# Patient Record
Sex: Male | Born: 1960
Health system: Southern US, Community
[De-identification: ages and names within clinical notes are randomized; demographics above are authoritative.]

---

## 2003-05-01 ENCOUNTER — Encounter (INDEPENDENT_AMBULATORY_CARE_PROVIDER_SITE_OTHER): Payer: Self-pay | Admitting: *Deleted

## 2003-05-01 ENCOUNTER — Ambulatory Visit (HOSPITAL_BASED_OUTPATIENT_CLINIC_OR_DEPARTMENT_OTHER): Admission: RE | Admit: 2003-05-01 | Discharge: 2003-05-01 | Payer: Self-pay | Admitting: Plastic Surgery

## 2003-05-01 ENCOUNTER — Ambulatory Visit (HOSPITAL_COMMUNITY): Admission: RE | Admit: 2003-05-01 | Discharge: 2003-05-01 | Payer: Self-pay | Admitting: Plastic Surgery

## 2003-06-21 ENCOUNTER — Ambulatory Visit (HOSPITAL_BASED_OUTPATIENT_CLINIC_OR_DEPARTMENT_OTHER): Admission: RE | Admit: 2003-06-21 | Discharge: 2003-06-21 | Payer: Self-pay | Admitting: Internal Medicine

## 2004-06-22 ENCOUNTER — Encounter: Admission: RE | Admit: 2004-06-22 | Discharge: 2004-06-22 | Payer: Self-pay | Admitting: Internal Medicine

## 2009-07-03 ENCOUNTER — Ambulatory Visit (HOSPITAL_COMMUNITY): Admission: RE | Admit: 2009-07-03 | Discharge: 2009-07-04 | Payer: Self-pay | Admitting: Otolaryngology

## 2009-10-07 ENCOUNTER — Ambulatory Visit (HOSPITAL_BASED_OUTPATIENT_CLINIC_OR_DEPARTMENT_OTHER): Admission: RE | Admit: 2009-10-07 | Discharge: 2009-10-07 | Payer: Self-pay | Admitting: Otolaryngology

## 2009-10-10 ENCOUNTER — Ambulatory Visit: Payer: Self-pay | Admitting: Internal Medicine

## 2010-07-12 LAB — CBC
HCT: 46.6 % (ref 39.0–52.0)
Hemoglobin: 16.1 g/dL (ref 13.0–17.0)
MCHC: 34.6 g/dL (ref 30.0–36.0)
MCV: 85.4 fL (ref 78.0–100.0)
Platelets: 229 10*3/uL (ref 150–400)
RBC: 5.46 MIL/uL (ref 4.22–5.81)
RDW: 13.5 % (ref 11.5–15.5)
WBC: 6.2 10*3/uL (ref 4.0–10.5)

## 2010-07-12 LAB — BASIC METABOLIC PANEL
BUN: 10 mg/dL (ref 6–23)
CO2: 27 mEq/L (ref 19–32)
Calcium: 9.5 mg/dL (ref 8.4–10.5)
Chloride: 108 mEq/L (ref 96–112)
Creatinine, Ser: 1.06 mg/dL (ref 0.4–1.5)
GFR calc Af Amer: >60 mL/min (ref >60)
GFR calc non Af Amer: >60 mL/min (ref >60)
Glucose, Bld: 102 mg/dL — ABNORMAL HIGH (ref 70–99)
Potassium: 4 mEq/L (ref 3.5–5.1)
Sodium: 141 mEq/L (ref 135–145)

## 2010-12-28 ENCOUNTER — Other Ambulatory Visit: Payer: Self-pay

## 2011-04-22 IMAGING — CR DG CHEST 2V
2 series · 2 of 2 positions shown · non-contrast
Comparison: 06/22/2004

CLINICAL DATA: Preoperative respiratory exam for septoplasty.
Sleep apnea.

CHEST - 2 VIEW

[view not recorded (1 of 2)]
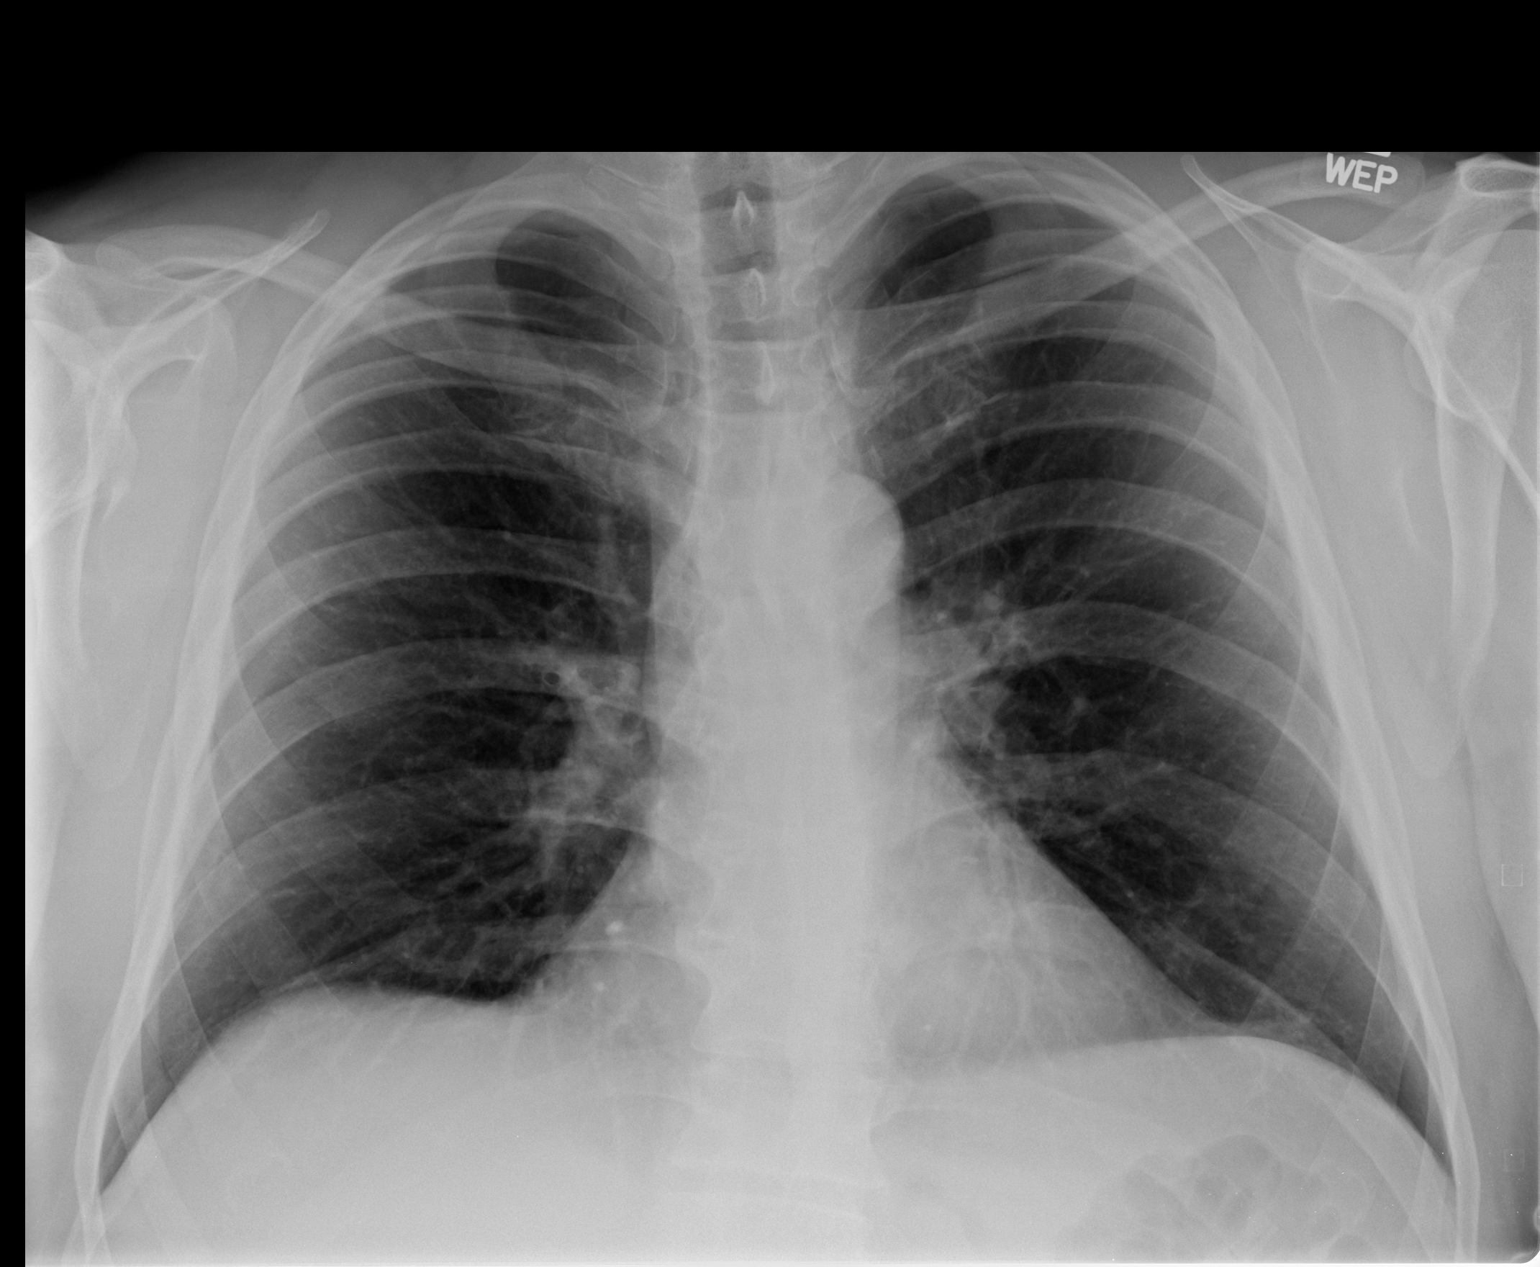

[view not recorded (2 of 2)]
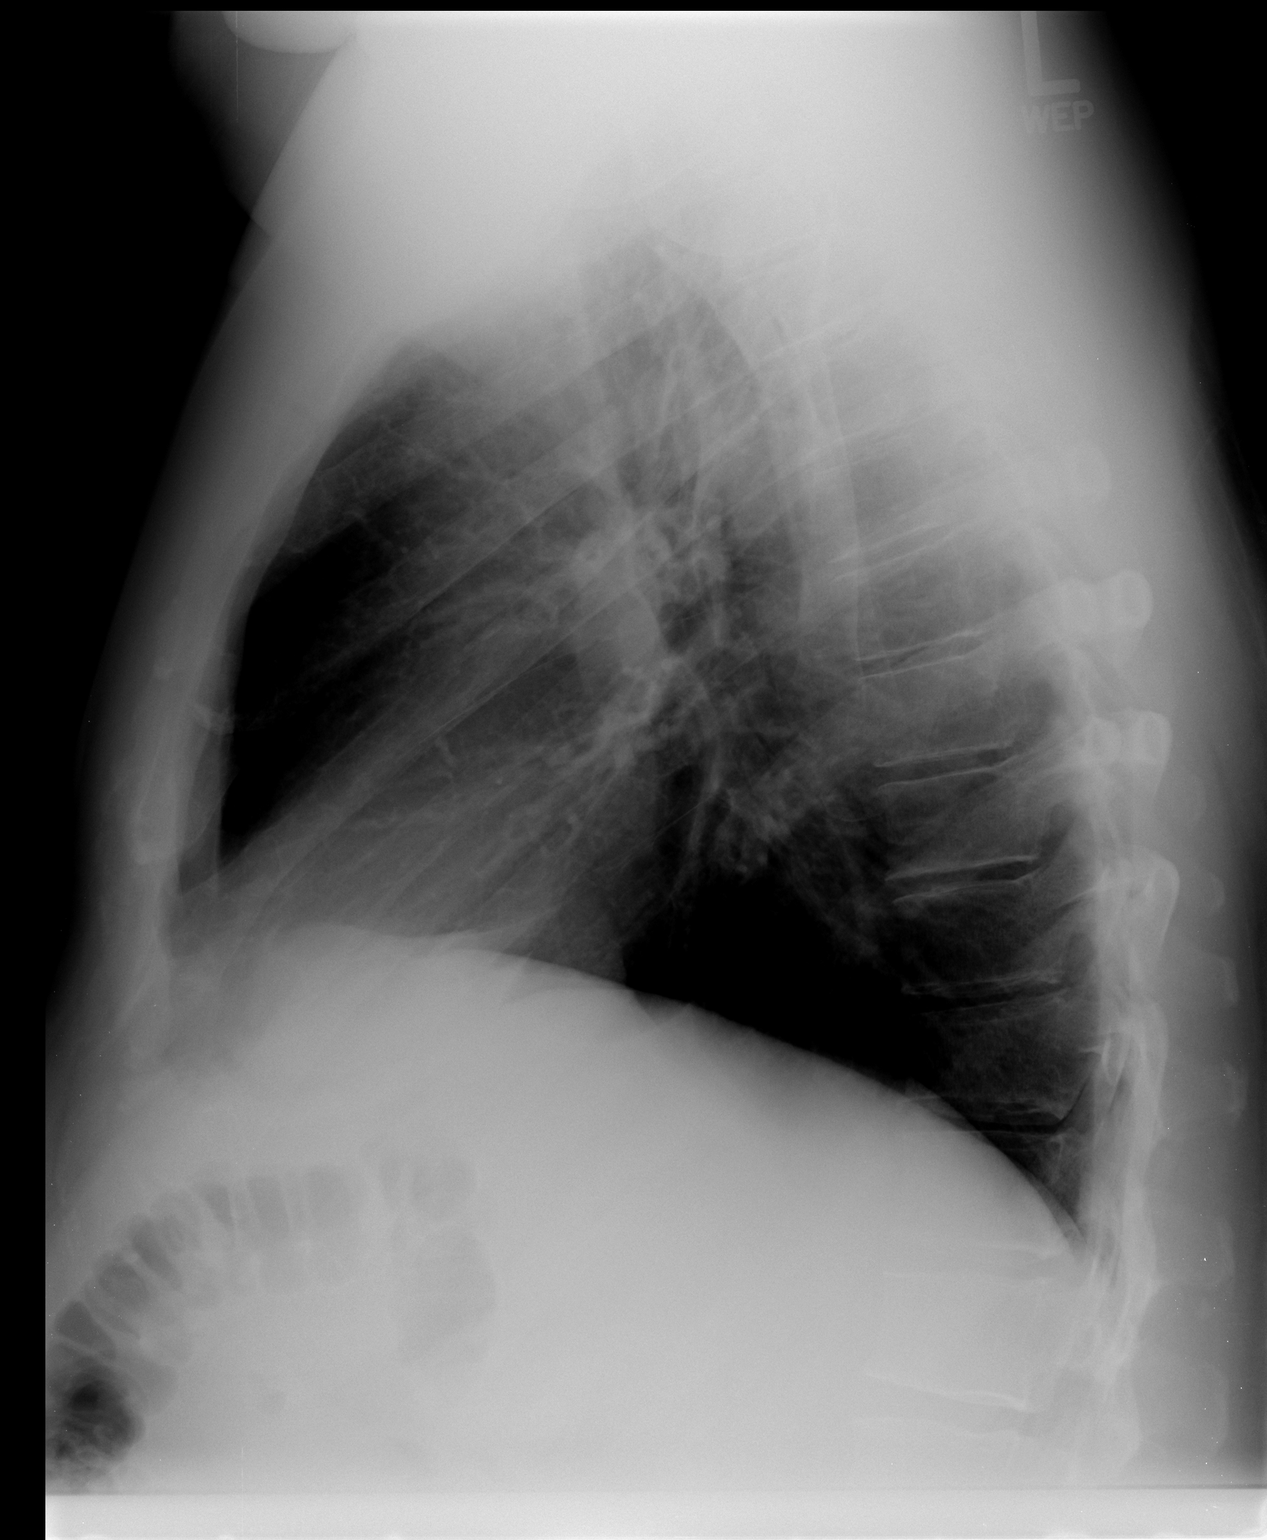

[2 of 2 positions shown; findings below may reference images not displayed]

FINDINGS: Heart size is normal.  The mediastinum is unremarkable.
Lungs are clear.  No effusions.  No bony abnormalities.
IMPRESSION: Normal chest

## 2017-09-27 DIAGNOSIS — E7800 Pure hypercholesterolemia, unspecified: Secondary | ICD-10-CM | POA: Diagnosis not present

## 2017-09-27 DIAGNOSIS — R739 Hyperglycemia, unspecified: Secondary | ICD-10-CM | POA: Diagnosis not present

## 2017-09-27 DIAGNOSIS — G4730 Sleep apnea, unspecified: Secondary | ICD-10-CM | POA: Diagnosis not present

## 2017-09-27 DIAGNOSIS — R7301 Impaired fasting glucose: Secondary | ICD-10-CM | POA: Diagnosis not present

## 2017-09-27 DIAGNOSIS — Z0000 Encounter for general adult medical examination without abnormal findings: Secondary | ICD-10-CM | POA: Diagnosis not present

## 2017-09-27 DIAGNOSIS — Z125 Encounter for screening for malignant neoplasm of prostate: Secondary | ICD-10-CM | POA: Diagnosis not present

## 2017-09-27 DIAGNOSIS — I10 Essential (primary) hypertension: Secondary | ICD-10-CM | POA: Diagnosis not present

## 2017-10-25 DIAGNOSIS — G4733 Obstructive sleep apnea (adult) (pediatric): Secondary | ICD-10-CM | POA: Diagnosis not present

## 2017-11-09 DIAGNOSIS — Z86010 Personal history of colonic polyps: Secondary | ICD-10-CM | POA: Diagnosis not present

## 2017-11-09 DIAGNOSIS — K5730 Diverticulosis of large intestine without perforation or abscess without bleeding: Secondary | ICD-10-CM | POA: Diagnosis not present

## 2018-01-29 ENCOUNTER — Other Ambulatory Visit: Payer: Self-pay | Admitting: Podiatry

## 2018-01-29 ENCOUNTER — Ambulatory Visit (INDEPENDENT_AMBULATORY_CARE_PROVIDER_SITE_OTHER): Payer: 59 | Admitting: Podiatry

## 2018-01-29 ENCOUNTER — Encounter: Payer: Self-pay | Admitting: Podiatry

## 2018-01-29 ENCOUNTER — Ambulatory Visit (INDEPENDENT_AMBULATORY_CARE_PROVIDER_SITE_OTHER): Payer: 59

## 2018-01-29 VITALS — BP 156/90 | HR 70 | Resp 16

## 2018-01-29 DIAGNOSIS — M79672 Pain in left foot: Secondary | ICD-10-CM

## 2018-01-29 DIAGNOSIS — M1000 Idiopathic gout, unspecified site: Secondary | ICD-10-CM | POA: Diagnosis not present

## 2018-01-29 DIAGNOSIS — M779 Enthesopathy, unspecified: Secondary | ICD-10-CM

## 2018-01-29 MED ORDER — TRIAMCINOLONE ACETONIDE 10 MG/ML IJ SUSP
10.0000 mg | Freq: Once | INTRAMUSCULAR | Status: AC
  Administered 2018-01-29: 10 mg

## 2018-01-29 NOTE — Patient Instructions (Signed)

## 2018-01-29 NOTE — Progress Notes (Signed)
   Subjective:    Patient ID: Danny Nunez, male    DOB: 07-13-1960, 57 y.o.   MRN: [id number]  HPI    Review of Systems  All other systems reviewed and are negative.      Objective:   Physical Exam        Assessment & Plan:

## 2018-01-30 NOTE — Progress Notes (Signed)
Subjective:   Patient ID: Danny Nunez, male   DOB: 57 y.o.   MRN: [id number]   HPI Patient presents with inflammation pain of the big toe joint left stating that has had several different episodes of this and this recent one has been for the last few weeks.  He did go to [employer] and it seemed to occur after that has been very tender and he does not remember specific injury and has history of his father.  Patient does not smoke and likes to be active   Review of Systems  All other systems reviewed and are negative.       Objective:  Physical Exam  Constitutional: He appears well-developed and well-nourished.  Cardiovascular: Intact distal pulses.  Pulmonary/Chest: Effort normal.  Musculoskeletal: Normal range of motion.  Neurological: He is alert.  Skin: Skin is warm.  Nursing note and vitals reviewed.   Neurovascular status intact muscle strength adequate range of motion within normal limits with patient found to have inflammation of the inner phalangeal joint left big toe with patient upon questioning membrane injury when he was young.  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  At this point I did a proximal nerve block I then went ahead and injected the interphalangeal joint with 3 mg Kenalog 5 mg Xylocaine advised on anti-inflammatories and I want to see him immediately if another attack occurs and we will get extensive blood work.  We will see how he responds to this first and decide if anything else is necessary  X-ray indicates there is changes of the inner phalangeal joint medial side left hallux indicating either localized osteoarthritic process trauma process or possible systemic inflammatory process

## 2018-04-14 DIAGNOSIS — J019 Acute sinusitis, unspecified: Secondary | ICD-10-CM | POA: Diagnosis not present

## 2019-03-18 ENCOUNTER — Other Ambulatory Visit: Payer: Self-pay | Admitting: Podiatry

## 2019-03-18 ENCOUNTER — Ambulatory Visit: Payer: 59 | Admitting: Podiatry

## 2019-03-18 ENCOUNTER — Ambulatory Visit: Payer: BC Managed Care – PPO

## 2019-03-18 ENCOUNTER — Other Ambulatory Visit: Payer: Self-pay

## 2019-03-18 ENCOUNTER — Ambulatory Visit (INDEPENDENT_AMBULATORY_CARE_PROVIDER_SITE_OTHER): Payer: BC Managed Care – PPO

## 2019-03-18 ENCOUNTER — Encounter: Payer: Self-pay | Admitting: Podiatry

## 2019-03-18 DIAGNOSIS — M779 Enthesopathy, unspecified: Secondary | ICD-10-CM

## 2019-03-18 DIAGNOSIS — M1000 Idiopathic gout, unspecified site: Secondary | ICD-10-CM

## 2019-03-18 DIAGNOSIS — M7752 Other enthesopathy of left foot: Secondary | ICD-10-CM

## 2019-03-18 DIAGNOSIS — M79672 Pain in left foot: Secondary | ICD-10-CM

## 2019-03-18 NOTE — Patient Instructions (Signed)

## 2019-03-18 NOTE — Progress Notes (Signed)
Subjective:   Patient ID: Danny Nunez, male   DOB: 58 y.o.   MRN: [id number]   HPI Patient presents stating I developed this inflammation pain around my big toe joint left and its been going on for around a week and I do not remember any other changes and its the first time that is occurred since I saw you 14 months ago   ROS      Objective:  Physical Exam  Neurovascular status intact with inflammation pain and redness of the first MPJ left with fluid buildup around the joint surface     Assessment:  Inflammatory capsulitis first MPJ left with also gout as a strong possibility     Plan:  Reviewed both conditions and at this point I did do sterile prep and injected around the first MPJ 3 mg Kenalog 5 mg Xylocaine and then discussed gout and gave information on gout diet and discussed that we may start him on medicine and gave him ideas on what he can do if he gets another attack and if they become more consistent we will need to consider other treatment options

## 2019-03-28 ENCOUNTER — Ambulatory Visit: Payer: 59 | Admitting: Podiatry
